# Patient Record
Sex: Male | Born: 2005 | Race: White | Hispanic: No | Marital: Single | Smoking: Never smoker
Health system: Southern US, Community
[De-identification: ages and names within clinical notes are randomized; demographics above are authoritative.]

## PROBLEM LIST (undated history)

## (undated) DIAGNOSIS — F988 Other specified behavioral and emotional disorders with onset usually occurring in childhood and adolescence: Secondary | ICD-10-CM

## (undated) DIAGNOSIS — L988 Other specified disorders of the skin and subcutaneous tissue: Secondary | ICD-10-CM

## (undated) DIAGNOSIS — R7303 Prediabetes: Secondary | ICD-10-CM

## (undated) DIAGNOSIS — R0600 Dyspnea, unspecified: Secondary | ICD-10-CM

## (undated) DIAGNOSIS — F909 Attention-deficit hyperactivity disorder, unspecified type: Secondary | ICD-10-CM

## (undated) DIAGNOSIS — T4145XA Adverse effect of unspecified anesthetic, initial encounter: Secondary | ICD-10-CM

## (undated) DIAGNOSIS — F913 Oppositional defiant disorder: Secondary | ICD-10-CM

## (undated) DIAGNOSIS — T8859XA Other complications of anesthesia, initial encounter: Secondary | ICD-10-CM

## (undated) HISTORY — PX: FRENULECTOMY, LINGUAL: SHX1681

---
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
## 2023-04-13 NOTE — ED Provider Notes (Signed)
 [facility] Emergency Department Provider Note   ED Clinical Impression   Final diagnoses:  Cat bite, initial encounter (Primary)  Rabies, need for prophylactic vaccination against     History   Chief Complaint Chief Complaint  Patient presents with  . Cat Bite    HPI  Sean Burnett is a 18 y.o. male with no pertinent past medical history presenting secondary to a bite to his L thumb from a stray cat around 9:40 PM. No other injuries from the incident. He was cleaning out his house and was outside, when he was bitten by the cat. Unknown vaccination status of cat. Patient is unsure of his last TdaP. No daily medications.    Impression, Medical Decision Making, ED Course   Impression and MDM: 18 y.o. male with the above history presenting secondary to a cat bite as discussed above. Overall well appearing. Vitals and full exam as below.  Examination notable for multiple punctate wounds which are hemostatic on the left first distal phalanx.  No other injuries identified.  No laceration to require suture.  No erythema, fever, or other indications of cellulitis.  Given that it was a stray cat, unable to quarantine, will provide rabies immunoglobulin and vaccine in addition to updating tetanus and providing prophylactic antibiotics.  Will plan for discharge with follow-up on day 3, 7, and 14 for repeat doses of rabies vaccine.  Discussed infectious/return precautions, appropriate for discharge.    MDM Elements Discussion of Management with other Physicians, QHP or Appropriate Source: None Independent Interpretation of Studies: None I have reviewed recent and relevant previous record, including: Outpatient notes - Gen surg note from 12/04/22 Escalation of Care including OBS/Admission/Transfer was considered: However, patient was determined to be appropriate for outpatient management. Social Determinants that significantly affected care: None  The case was discussed with the  attending physician, who is in agreement with the above assessment and plan.   No orders of the defined types were placed in this encounter.     Physical Exam   VITAL SIGNS:    Vitals:   04/13/23 0055  BP: 155/82  Pulse: 76  Resp: 16  Temp: 36.1 C (97 F)  TempSrc: Oral  SpO2: 100%  Weight: (!) 104.3 kg (230 lb)  Height: 180.3 cm (5' 11)    Constitutional: Alert and oriented. No acute distress. Eyes: Conjunctivae are normal. HEENT: Normocephalic and atraumatic. Conjunctivae clear.  Cardiovascular: Rate as above, regular rhythm. Normal and symmetric distal pulses. No lower extremity edema Respiratory: Normal respiratory effort. Clear breath sounds bilaterally. No wheezes, rhonchi, rales.  Genitourinary: Deferred. Musculoskeletal: Non-tender with normal range of motion in all extremities. Neurologic: Normal speech and language. No facial asymmetry. No gross focal neurologic deficits are appreciated. Patient moves all extremities equally. Skin: Punctate hemostatic wounds on the first distal phalanx of the left hand.  No laceration or surrounding erythema. Psychiatric: Mood, affect, and speech grossly normal.     Other History   Past Medical History:  Diagnosis Date  . ADHD (attention deficit hyperactivity disorder)     Past Surgical History:  Procedure Laterality Date  . ORTHOPEDIC SURGERY     leg fx     Current Facility-Administered Medications:  .  amoxicillin -clavulanate (AUGMENTIN ) 875-125 mg per tablet 1 tablet, 1 tablet, Oral, Once, Puscheck, Jesse A, MD .  diph,pertuss(acel),tetanus vaccine-Tdap (BOOSTRIX) injection 0.5 mL, 0.5 mL, Intramuscular, Once, Puscheck, Jesse A, MD .  rabies immune globulin (PF) (HyperRAB) injection, 20 Units/kg, Intramuscular, Once, Puscheck, Josefa LABOR, MD .  rabies vaccine, PCEC (RABAVERT) injection, 1 mL, Intramuscular, Once, Puscheck, Josefa LABOR, MD  Current Outpatient Medications:  .  ADDERALL XR 10 mg 24 hr capsule, TAKE 2  CAPSULES BY MOUTH ONCE DAILY IN THE MORNING, Disp: , Rfl:  .  amoxicillin -clavulanate (AUGMENTIN ) 875-125 mg per tablet, Take 1 tablet by mouth two (2) times a day for 6 days., Disp: 12 tablet, Rfl: 0 .  cloNIDine  HCL (CATAPRES ) 0.1 MG tablet, Take 0.1 mg by mouth., Disp: , Rfl:  .  eszopiclone (LUNESTA) 2 MG Tab, , Disp: , Rfl:   Allergies Aripiprazole, Codeine, and Risperidone  Family History History reviewed. No pertinent family history.  Social History Social History   Tobacco Use  . Smoking status: Never  . Smokeless tobacco: Never  Substance Use Topics  . Alcohol use: Never     Radiology   No orders to display    Pertinent labs & imaging results that were available during my care of the patient were independently interpreted by me and considered in my medical decision making (see chart for details).  Portions of this record have been created using Scientist, clinical (histocompatibility and immunogenetics). Dictation errors have been sought, but may not have been identified and corrected.  Documentation assistance was provided by Rodolph Butcher, Scribe on April 13, 2023 at 1:03 AM for [facility], DO.  April 13, 2023 2:37 AM. Documentation assistance provided by the scribe. I was present during the time the encounter was recorded. The information recorded by the scribe was done at my direction and has been reviewed and validated by me.       Puscheck, Josefa LABOR, MD Resident 04/13/23 [phone]

## 2023-04-13 NOTE — ED Triage Notes (Signed)
 Patient reports he was bitten by a stray cat to left thumb PTA. Unknown rabies vaccination.  Unknown when last tetanus was.

## 2023-04-13 NOTE — ED Notes (Signed)
 [facility] Emergency Department Attestation Note     ED Clinical Impression    Final diagnoses:  Cat bite, initial encounter (Primary)  Rabies, need for prophylactic vaccination against      ED Attending Physician Teaching Attestation    I supervised care provided by the resident. We have discussed the case, I have reviewed the note and I agree with the plan of treatment except as documented in my note. I saw and evaluated the patient, participating in the key portions of the patient's care.    Procedure performed: none    ED Attending Note    Vitals:   04/13/23 0055  BP: 155/82  Pulse: 76  Resp: 16  Temp: 36.1 C (97 F)  TempSrc: Oral  SpO2: 100%  Weight: (!) 104.3 kg (230 lb)  Height: 180.3 cm (5' 11)    I have reviewed the patient's vital signs and the nursing notes. Any pertinent labs & imaging results which were available during my care of the patient were reviewed by me. See chart, resident and nursing documentation for additional ED course details.  Portions of this record have been created using Scientist, clinical (histocompatibility and immunogenetics). Dictation errors have been sought, but may not have been identified and corrected.

## 2023-04-16 NOTE — ED Triage Notes (Signed)
 Pt is here for 2nd rabies series vaccine, states his cat bite wound is healing well with no issues.

## 2023-04-23 NOTE — ED Triage Notes (Signed)
 Pt presents here for follow up series. Denies any issues from previous shots

## 2023-05-16 NOTE — Progress Notes (Signed)
 [facility] [employer] was able to reach the patient by phone call.  The details of interventions are as follows:  Preventive Care-     05/16/2023    2:43 PM  Gap Closure  Age Group: Pediatric  Pediatric Yearly Physical Interventions [facility] direct scheduled appointment  Comments: Scheduled for 06/12/23    Sean Burnett  For more information on [facility] services, click here.

## 2023-07-10 NOTE — Progress Notes (Signed)
 [facility] [employer] was not able to reach the patient by phone call.  The details of interventions are as follows:  Preventive Care-     07/10/2023   11:10 AM  Gap Closure  Age Group: Pediatric  Pediatric Yearly Physical Interventions Unable to reach-- reminder message sent  Comments: lvm    Sean Burnett  For more information on [facility] services, click here.

## 2023-08-26 NOTE — Progress Notes (Signed)
 Chief Complaint  Patient presents with  . Annual Exam    Subjective  Sean Burnett is a 18 y.o. male who presents for Annual Exam HPI History of Present Illness Sean Burnett is a 18 year old male who presents for an annual physical exam and a referral for mental health evaluation.  He is concerned about anxiety, depression, and possible bipolar disorder. He has not been previously diagnosed with these conditions nor has he seen a psychiatrist. A depression and anxiety screening indicated a depression score of two and an anxiety score of thirteen. He has not been screened for bipolar disorder and is uncertain about past treatment for depression or anxiety. He denies experiencing hallucinations or extreme mood swings.  He has experienced a significant weight gain from 234 pounds to 259 pounds over the past eight months, which he attributes to being unemployed and possibly eating more due to anxiety. He was previously employed at a [employer] center but lost his job due to issues obtaining a doctor's note for dental problems. He is concerned about his weight gain and reports having had slightly elevated glucose levels in the past.  He wants to get screened for diabetes  He reports occasional food insecurity, having filled out a questionnaire indicating that he sometimes runs out of food and worries about it. He lives alone and has been unemployed, which may contribute to this issue.  He uses nicotine but did not specify alcohol consumption. No current use of other substances.  Review of Systems  Patient Active Problem List  Diagnosis  . ADHD (attention deficit hyperactivity disorder)  . Behavior disturbance  . Oppositional defiant disorder  . Other specified episodic mood disorder ()  . BMI (body mass index), pediatric, 95-99% for age  . Elevated blood pressure    Outpatient Medications Prior to Visit  Medication Sig Dispense Refill  . ADDERALL XR 10 mg XR capsule  (Patient  not taking: Reported on 08/26/2023)    . eszopiclone (LUNESTA) 2 MG tablet  (Patient not taking: Reported on 08/26/2023)     No facility-administered medications prior to visit.      Objective  Vitals:   08/26/23 1057  BP: 128/80  Pulse: 66  SpO2: 97%  Weight: (!) 117.5 kg (259 lb)  Height: 177.2 cm (5' 9.78)  PainSc: 0-No pain   Body mass index is 37.39 kg/m.  Home Vitals:     Physical Exam Physical Exam MEASUREMENTS: Weight- 259. HEENT: Oral cavity dry.  Constitutional: alert, obese, in NAD, and communicates well Eye exam: pupils equal and reactive, extraocular eye movements intact. Neck: supple, no thyroid enlargement or cervical adenopathy, and no bruits heard Respiratory: clear to auscultation, without rales or wheezes  Cardiovascular: regular rate and rhythm and without murmurs, rubs or gallops Lower extremities: no lower extremity edema Skin ankles/feet: warm, good capillary refill Neurological: sensorimotor grossly intact and normal muscle tone  Results       Assessment/Plan:   Assessment & Plan  Anxiety disorder Positive anxiety screening with a score of 13. No prior psychiatric evaluation or treatment. Anxiety may contribute to weight gain and eating habits. No evidence of bipolar disorder, hallucinations, or manic episodes. - Refer to behavioral specialist for further evaluation and management - Discuss potential for psychiatric referral and medication management if indicated  Class II obesity BMI 37 Weight increased from 234 lbs to 259 lbs over 8 months. Possible contribution from anxiety and unemployment. Reports not eating excessively but acknowledges weight gain. - Encourage lifestyle  modifications including increased physical activity and dietary changes - Discuss potential impact of anxiety on eating habits  Elevated glucose Patient with family history of potential diabetes.  He reports he has gained tremendous amount of weight and he has not  been careful with his diet. We will screen for diabetes using an hemoglobin A1c.  Adult Wellness Visit Routine adult wellness visit with weight gain from 234 lbs to 259 lbs over 8 months. Positive anxiety screening. No significant findings on depression screening. Limited family contact precludes knowledge of family history of diabetes. Unemployment may contribute to weight gain. - Perform physical examination - Order lab tests including glucose, cholesterol, HIV, and hepatitis C screening  Nicotine dependence Patient was counseled about the importance of stopping use of tobacco products particularly smoking.  He said he is not ready for it.  Food insecurity Reported occasional food insecurity with concerns about running out of food. Referral to [facility] program for assistance accepted. - Refer to [facility] program for assistance with food insecurity Diagnoses and all orders for this visit:  Anxiety -     Anxiety Screen [NUR6106] -     Ambulatory Referral to [facility] Pediatric Behavioral Health (23 Years and Under)  Class 2 severe obesity due to excess calories with serious comorbidity and body mass index (BMI) of 37.0 to 37.9 in adult (CMS/HHS-HCC)  Encounter for behavioral health screening (Z13.30)  Elevated glucose -     Hemoglobin A1C; Future  Need for vaccination  Depression screening (Z13.31) -     Depression Screen -(PHQ- 2/9, BDI)  Encounter for routine history and physical exam for male -     Anxiety Screen [NUR6106]  Screening for HIV (human immunodeficiency virus) -     HIV-1 And HIV-2 Antibody & Antigen  Need for hepatitis C screening test -     Hepatitis C Antibody with reflex to PCR  Screening for cardiovascular condition -     Lipid Panel W/Reflex Direct Low Density Lipoprotein (LDL) Cholesterol  Need for financial support -     Ambulatory Referral to Centralized Social Support/[facility])    This visit was coded based on medical decision making  (MDM).           Future Appointments   This patient does not currently have any appointments scheduled.     Patient Instructions  Diagnoses and all orders for this visit:  Anxiety -     Anxiety Screen [NUR6106] -     Ambulatory Referral to [facility]al [facility] (23 Years and Under)  Class 2 severe obesity due to excess calories with serious comorbidity and body mass index (BMI) of 37.0 to 37.9 in adult (CMS/HHS-HCC)  Encounter for behavioral health screening (Z13.30)  Elevated glucose -     Hemoglobin A1C; Future  Need for vaccination  Depression screening (Z13.31) -     Depression Screen -(PHQ- 2/9, BDI)  Encounter for routine history and physical exam for male -     Anxiety Screen [NUR6106]  Screening for HIV (human immunodeficiency virus) -     HIV-1 And HIV-2 Antibody & Antigen  Need for hepatitis C screening test -     Hepatitis C Antibody with reflex to PCR  Screening for cardiovascular condition -     Lipid Panel W/Reflex Direct Low Density Lipoprotein (LDL) Cholesterol  Need for financial support -     Ambulatory Referral to Centralized Social Support/[facility])     An after visit summary was provided for the patient either  in written format (printed) or through [facility].  This note has been created using automated tools and reviewed for accuracy by MARIO E OLMEDO.

## 2023-08-30 NOTE — Progress Notes (Signed)
  [facility] Care Management - Unable to Reach for New Service Line Referral  [facility] Coordinator made multiple attempts to contact the patient for a community resources. Contact information was left with patient.  At this time, we have not been able to connect Sean Burnett to [employer].   Sean Burnett    [address]; [place], [place] [address] l  [email or web address] l [phone]   For more information on [facility] services, click here.  [facility] Centralized Support Referral Closure Note Sean Burnett was referred by provider for assistance with health-related social needs:  Care concerns addressed as described above. No further action needed at this time. Closing case.  Provider notified on: 08/30/23.    Sean Burnett    [address]; [place], [place] [address] l  [email or web address] l [phone]   For more information on [facility] services, click here.

## 2023-11-05 ENCOUNTER — Ambulatory Visit: Payer: Self-pay | Admitting: Surgery

## 2023-11-05 NOTE — H&P (Signed)
 Subjective:   CC: Pilonidal disease [L98.8]  HPI: returns for evaluation of above.   History of Present Illness Sean Burnett is a 18 year old male who presents with a recurrent, enlarging, and painful pilonidal disease.  Area enlarges, becomes painful, and fills with fluid more rapidly than before. Warm compresses are sometimes effective in managing swelling, but significant pain persists. area presents as a red spot that enlarges and leaks fluid, leaving a noticeable cavity. Currently, there are no signs of infection.    Past Medical History:  has a past medical history of ADHD (attention deficit hyperactivity disorder), Oppositional defiant disorder, and School problem.  Past Surgical History:  has no past surgical history on file.  Family History: family history includes ADD / ADHD in his father and mother; Bipolar disorder in his father; Cancer in his paternal grandfather; Multiple sclerosis in his paternal grandmother; Substance Abuse in his father.  Social History:  reports that he has never smoked. He has never used smokeless tobacco. He reports that he does not currently use alcohol. He reports that he does not currently use drugs.  Current Medications: currently has no medications in their medication list.  Allergies:  Allergies  Allergen Reactions   Abilify [Aripiprazole] Unknown   Codeine Unknown   Risperidone Unknown    ROS:  A 15 point review of systems was performed and pertinent positives and negatives noted in HPI   Objective:     BP 115/79 (BP Location: Left upper arm, Patient Position: Sitting, BP Cuff Size: Large Adult)   Pulse 70   Ht 175.3 cm (5' 9)   Wt (!) 117.5 kg (259 lb)   BMI 38.25 kg/m   Constitutional :  No distress, cooperative, alert  Lymphatics/Throat:  Supple with no lymphadenopathy  Respiratory:  Clear to auscultation bilaterally  Cardiovascular:  Regular rate and rhythm  Gastrointestinal: Soft, non-tender, non-distended, no  organomegaly.  Musculoskeletal: Steady gait and movement  Skin: Cool and moist, obvious pilonidal disease, no active infection.  Psychiatric: Normal affect, non-agitated, not confused         LABS:  N/a   RADS: N/a  Assessment:      Pilonidal disease [L98.8]  Plan:     1. Pilonidal disease [L98.8] Discussed surgical excision.  Alternatives include continued observation.  Benefits include possible symptom relief, pathologic evaluation, improved cosmesis. Discussed the risk of surgery including recurrence, chronic pain, post-op infxn, poor cosmesis, poor/delayed wound healing, and possible re-operation to address said risks. The risks of general anesthetic, if used, includes MI, CVA, sudden death or even reaction to anesthetic medications also discussed.  Typical post-op recovery time of 3-5 days with possible activity restrictions were also discussed.  The patient verbalized understanding and all questions were answered to the patient's satisfaction.  2. Patient has elected to proceed with surgical treatment. Procedure will be scheduled. With bascom flap.  labs/images/medications/previous chart entries reviewed personally and relevant changes/updates noted above.

## 2023-11-05 NOTE — H&P (View-Only) (Signed)
 Subjective:   CC: Pilonidal disease [L98.8]  HPI: returns for evaluation of above.   History of Present Illness Sean Burnett is a 18 year old male who presents with a recurrent, enlarging, and painful pilonidal disease.  Area enlarges, becomes painful, and fills with fluid more rapidly than before. Warm compresses are sometimes effective in managing swelling, but significant pain persists. area presents as a red spot that enlarges and leaks fluid, leaving a noticeable cavity. Currently, there are no signs of infection.    Past Medical History:  has a past medical history of ADHD (attention deficit hyperactivity disorder), Oppositional defiant disorder, and School problem.  Past Surgical History:  has no past surgical history on file.  Family History: family history includes ADD / ADHD in his father and mother; Bipolar disorder in his father; Cancer in his paternal grandfather; Multiple sclerosis in his paternal grandmother; Substance Abuse in his father.  Social History:  reports that he has never smoked. He has never used smokeless tobacco. He reports that he does not currently use alcohol. He reports that he does not currently use drugs.  Current Medications: currently has no medications in their medication list.  Allergies:  Allergies  Allergen Reactions   Abilify [Aripiprazole] Unknown   Codeine Unknown   Risperidone Unknown    ROS:  A 15 point review of systems was performed and pertinent positives and negatives noted in HPI   Objective:     BP 115/79 (BP Location: Left upper arm, Patient Position: Sitting, BP Cuff Size: Large Adult)   Pulse 70   Ht 175.3 cm (5' 9)   Wt (!) 117.5 kg (259 lb)   BMI 38.25 kg/m   Constitutional :  No distress, cooperative, alert  Lymphatics/Throat:  Supple with no lymphadenopathy  Respiratory:  Clear to auscultation bilaterally  Cardiovascular:  Regular rate and rhythm  Gastrointestinal: Soft, non-tender, non-distended, no  organomegaly.  Musculoskeletal: Steady gait and movement  Skin: Cool and moist, obvious pilonidal disease, no active infection.  Psychiatric: Normal affect, non-agitated, not confused         LABS:  N/a   RADS: N/a  Assessment:      Pilonidal disease [L98.8]  Plan:     1. Pilonidal disease [L98.8] Discussed surgical excision.  Alternatives include continued observation.  Benefits include possible symptom relief, pathologic evaluation, improved cosmesis. Discussed the risk of surgery including recurrence, chronic pain, post-op infxn, poor cosmesis, poor/delayed wound healing, and possible re-operation to address said risks. The risks of general anesthetic, if used, includes MI, CVA, sudden death or even reaction to anesthetic medications also discussed.  Typical post-op recovery time of 3-5 days with possible activity restrictions were also discussed.  The patient verbalized understanding and all questions were answered to the patient's satisfaction.  2. Patient has elected to proceed with surgical treatment. Procedure will be scheduled. With bascom flap.  labs/images/medications/previous chart entries reviewed personally and relevant changes/updates noted above.

## 2023-11-15 ENCOUNTER — Encounter
Admission: RE | Admit: 2023-11-15 | Discharge: 2023-11-15 | Disposition: A | Discharge status: Home / Self Care | Source: Ambulatory Visit | Attending: Surgery | Admitting: Surgery

## 2023-11-15 ENCOUNTER — Other Ambulatory Visit: Payer: Self-pay

## 2023-11-15 HISTORY — DX: Dyspnea, unspecified: R06.00

## 2023-11-15 HISTORY — DX: Other specified disorders of the skin and subcutaneous tissue: L98.8

## 2023-11-15 HISTORY — DX: Prediabetes: R73.03

## 2023-11-15 NOTE — Patient Instructions (Addendum)
 Your procedure is scheduled on: 11/21/23 - Thursday Report to the Registration Desk on the 1st floor of the [facility]. To find out your arrival time, please call [phone] between 1PM - 3PM on: 11/20/23 - Wednesday If your arrival time is 6:00 am, do not arrive before that time as the [facility] entrance doors do not open until 6:00 am.  REMEMBER: Instructions that are not followed completely may result in serious medical risk, up to and including death; or upon the discretion of your surgeon and anesthesiologist your surgery may need to be rescheduled.  Do not eat food after midnight the night before surgery.  No gum chewing or hard candies.  You may however, drink CLEAR liquids up to 2 hours before you are scheduled to arrive for your surgery. Do not drink anything within 2 hours of your scheduled arrival time.  Clear liquids include: - water  - apple juice without pulp - gatorade (not RED colors) - black coffee or tea (Do NOT add milk or creamers to the coffee or tea) Do NOT drink anything that is not on this list.  One week prior to surgery: Stop Anti-inflammatories (NSAIDS) such as Advil , Aleve , Ibuprofen , Motrin , Naproxen , Naprosyn  and Aspirin based products such as Excedrin, Goody's Powder, BC Powder. You may take Tylenol  if needed for pain up until the day of surgery.  Stop ANY OVER THE COUNTER supplements until after surgery.  ON THE DAY OF SURGERY ONLY TAKE THESE MEDICATIONS WITH SIPS OF WATER:  none   No Alcohol for 24 hours before or after surgery.  No Smoking including e-cigarettes for 24 hours before surgery.  No chewable tobacco products for at least 6 hours before surgery.  No nicotine patches on the day of surgery.  Do not use any recreational drugs for at least a week (preferably 2 weeks) before your surgery.  Please be advised that the combination of cocaine and anesthesia may have negative outcomes, up to and including death. If you test positive  for cocaine, your surgery will be cancelled.  On the morning of surgery brush your teeth with toothpaste and water, you may rinse your mouth with mouthwash if you wish. Do not swallow any toothpaste or mouthwash.  Do not wear jewelry, make-up, hairpins, clips or nail polish.  For welded (permanent) jewelry: bracelets, anklets, waist bands, etc.  Please have this removed prior to surgery.  If it is not removed, there is a chance that hospital personnel will need to cut it off on the day of surgery.  Do not wear lotions, powders, or perfumes.   Do not shave body hair from the neck down 48 hours before surgery.  Contact lenses, hearing aids and dentures may not be worn into surgery.  Do not bring valuables to the hospital. [facility] is not responsible for any missing/lost belongings or valuables.   Notify your doctor if there is any change in your medical condition (cold, fever, infection).  Wear comfortable clothing (specific to your surgery type) to the hospital.  If you are being admitted to the hospital overnight, leave your suitcase in the car. After surgery it may be brought to your room.  In case of increased patient census, it may be necessary for you, the patient, to continue your postoperative care in the Same Day Surgery department.  If you are being discharged the day of surgery, you will not be allowed to drive home. You will need a responsible individual to drive you home and stay  with you for 24 hours after surgery.   If you are taking public transportation, you will need to have a responsible individual with you.  Please call the Pre-admissions Testing Dept. at [phone] if you have any questions about these instructions.  Surgery Visitation Policy:  Patients having surgery or a procedure may have two visitors.  Children under the age of 34 must have an adult with them who is not the patient.  Inpatient Visitation:    Visiting hours are 7 a.m. to 8 p.m. Up  to four visitors are allowed at one time in a patient room. The visitors may rotate out with other people during the day.  One visitor age 48 or older may stay with the patient overnight and must be in the room by 8 p.m.   Merch[employer]iser, Retail to address health-related social needs:  [email or web address]

## 2023-11-20 MED ORDER — ORAL CARE MOUTH RINSE: 15.0000 mL | Freq: Once | OROMUCOSAL | Status: AC

## 2023-11-20 MED ORDER — CEFAZOLIN SODIUM-DEXTROSE 2-4 GM/100ML-% IV SOLN
2.0000 g | INTRAVENOUS | Status: AC
  Administered 2023-11-21: 2 g via INTRAVENOUS

## 2023-11-20 MED ORDER — ACETAMINOPHEN 500 MG PO TABS
1000.0000 mg | ORAL_TABLET | ORAL | Status: AC
  Administered 2023-11-21: 1000 mg via ORAL

## 2023-11-20 MED ORDER — CHLORHEXIDINE GLUCONATE 0.12 % MT SOLN
15.0000 mL | Freq: Once | OROMUCOSAL | Status: AC
  Administered 2023-11-21: 15 mL via OROMUCOSAL

## 2023-11-20 MED ORDER — CHLORHEXIDINE GLUCONATE CLOTH 2 % EX PADS: 6.0000 | MEDICATED_PAD | Freq: Once | CUTANEOUS | Status: DC

## 2023-11-20 MED ORDER — GABAPENTIN 300 MG PO CAPS
300.0000 mg | ORAL_CAPSULE | ORAL | Status: AC
  Administered 2023-11-21: 300 mg via ORAL

## 2023-11-20 MED ORDER — LACTATED RINGERS IV SOLN: INTRAVENOUS | Status: DC

## 2023-11-21 ENCOUNTER — Ambulatory Visit: Admitting: Anesthesiology

## 2023-11-21 ENCOUNTER — Encounter: Payer: Self-pay | Admitting: Surgery

## 2023-11-21 ENCOUNTER — Other Ambulatory Visit: Payer: Self-pay

## 2023-11-21 ENCOUNTER — Ambulatory Visit
Admission: RE | Admit: 2023-11-21 | Discharge: 2023-11-21 | Disposition: A | Discharge status: Home / Self Care | Attending: Surgery | Admitting: Surgery

## 2023-11-21 ENCOUNTER — Encounter
Admission: RE | Disposition: A | Discharge status: Home / Self Care | Payer: Self-pay | Source: Home / Self Care | Attending: Surgery

## 2023-11-21 DIAGNOSIS — L0591 Pilonidal cyst without abscess: Secondary | ICD-10-CM | POA: Diagnosis present

## 2023-11-21 HISTORY — PX: PILONIDAL CYST DRAINAGE: SHX743

## 2023-11-21 HISTORY — PX: PROCEDURE, ADVANCEMENT FLAP, BACK: SHX7623

## 2023-11-21 SURGERY — EXCISION, PILONIDAL CYST
Anesthesia: General | Site: Buttocks

## 2023-11-21 MED ORDER — CEFAZOLIN SODIUM-DEXTROSE 2-4 GM/100ML-% IV SOLN
INTRAVENOUS | Status: AC
  Filled 2023-11-21: qty 100

## 2023-11-21 MED ORDER — LIDOCAINE HCL (PF) 2 % IJ SOLN
INTRAMUSCULAR | Status: AC
  Filled 2023-11-21: qty 5

## 2023-11-21 MED ORDER — FENTANYL CITRATE (PF) 100 MCG/2ML IJ SOLN: 25.0000 ug | INTRAMUSCULAR | Status: DC | PRN

## 2023-11-21 MED ORDER — KETOROLAC TROMETHAMINE 30 MG/ML IJ SOLN
INTRAMUSCULAR | Status: AC
  Filled 2023-11-21: qty 1

## 2023-11-21 MED ORDER — OXYCODONE HCL 5 MG PO TABS: 5.0000 mg | ORAL_TABLET | Freq: Once | ORAL | Status: DC | PRN

## 2023-11-21 MED ORDER — ONDANSETRON HCL 4 MG/2ML IJ SOLN
4.0000 mg | Freq: Once | INTRAMUSCULAR | Status: AC | PRN
  Administered 2023-11-21: 4 mg via INTRAVENOUS

## 2023-11-21 MED ORDER — GABAPENTIN 300 MG PO CAPS
ORAL_CAPSULE | ORAL | Status: AC
  Filled 2023-11-21: qty 1

## 2023-11-21 MED ORDER — BUPIVACAINE-EPINEPHRINE 0.5% -1:200000 IJ SOLN
INTRAMUSCULAR | Status: DC | PRN
Start: 2023-11-21 — End: 2023-11-21
  Administered 2023-11-21: 30 mL

## 2023-11-21 MED ORDER — ROCURONIUM BROMIDE 100 MG/10ML IV SOLN
INTRAVENOUS | Status: DC | PRN
  Administered 2023-11-21: 50 mg via INTRAVENOUS

## 2023-11-21 MED ORDER — ACETAMINOPHEN 10 MG/ML IV SOLN: 1000.0000 mg | Freq: Once | INTRAVENOUS | Status: DC | PRN

## 2023-11-21 MED ORDER — CHLORHEXIDINE GLUCONATE 0.12 % MT SOLN
OROMUCOSAL | Status: AC
Start: 2023-11-21 — End: 2023-11-21
  Filled 2023-11-21: qty 15

## 2023-11-21 MED ORDER — PROPOFOL 10 MG/ML IV BOLUS
INTRAVENOUS | Status: AC
  Filled 2023-11-21: qty 20

## 2023-11-21 MED ORDER — SUGAMMADEX SODIUM 200 MG/2ML IV SOLN
INTRAVENOUS | Status: DC | PRN
  Administered 2023-11-21: 200 mg via INTRAVENOUS

## 2023-11-21 MED ORDER — ROCURONIUM BROMIDE 10 MG/ML (PF) SYRINGE
PREFILLED_SYRINGE | INTRAVENOUS | Status: AC
  Filled 2023-11-21: qty 10

## 2023-11-21 MED ORDER — LIDOCAINE HCL (CARDIAC) PF 100 MG/5ML IV SOSY
PREFILLED_SYRINGE | INTRAVENOUS | Status: DC | PRN
  Administered 2023-11-21: 60 mg via INTRAVENOUS

## 2023-11-21 MED ORDER — ONDANSETRON HCL 4 MG/2ML IJ SOLN
INTRAMUSCULAR | Status: AC
  Filled 2023-11-21: qty 2

## 2023-11-21 MED ORDER — OXYCODONE HCL 5 MG/5ML PO SOLN: 5.0000 mg | Freq: Once | ORAL | Status: DC | PRN

## 2023-11-21 MED ORDER — PROPOFOL 10 MG/ML IV BOLUS
INTRAVENOUS | Status: DC | PRN
  Administered 2023-11-21: 200 mg via INTRAVENOUS

## 2023-11-21 MED ORDER — ACETAMINOPHEN 325 MG PO TABS
650.0000 mg | ORAL_TABLET | Freq: Three times a day (TID) | ORAL | 0 refills | Status: AC | PRN
  Filled 2023-11-21: qty 40, 7d supply, fill #0

## 2023-11-21 MED ORDER — KETOROLAC TROMETHAMINE 30 MG/ML IJ SOLN
INTRAMUSCULAR | Status: DC | PRN
  Administered 2023-11-21: 30 mg via INTRAVENOUS

## 2023-11-21 MED ORDER — FENTANYL CITRATE (PF) 100 MCG/2ML IJ SOLN
INTRAMUSCULAR | Status: DC | PRN
  Administered 2023-11-21: 100 ug via INTRAVENOUS

## 2023-11-21 MED ORDER — OXYCODONE-ACETAMINOPHEN 5-325 MG PO TABS
1.0000 | ORAL_TABLET | Freq: Three times a day (TID) | ORAL | 0 refills | Status: AC | PRN
  Filled 2023-11-21: qty 6, 2d supply, fill #0

## 2023-11-21 MED ORDER — ACETAMINOPHEN 500 MG PO TABS
ORAL_TABLET | ORAL | Status: AC
  Filled 2023-11-21: qty 2

## 2023-11-21 MED ORDER — FENTANYL CITRATE (PF) 100 MCG/2ML IJ SOLN
INTRAMUSCULAR | Status: AC
  Filled 2023-11-21: qty 2

## 2023-11-21 MED ORDER — LACTATED RINGERS IV SOLN: INTRAVENOUS | Status: DC

## 2023-11-21 MED ORDER — MIDAZOLAM HCL (PF) 2 MG/2ML IJ SOLN
INTRAMUSCULAR | Status: DC | PRN
  Administered 2023-11-21: 2 mg via INTRAVENOUS

## 2023-11-21 MED ORDER — IBUPROFEN 800 MG PO TABS
800.0000 mg | ORAL_TABLET | Freq: Three times a day (TID) | ORAL | 0 refills | Status: AC | PRN
  Filled 2023-11-21: qty 30, 10d supply, fill #0

## 2023-11-21 MED ORDER — DOCUSATE SODIUM 100 MG PO CAPS
100.0000 mg | ORAL_CAPSULE | Freq: Two times a day (BID) | ORAL | 0 refills | Status: AC | PRN
  Filled 2023-11-21: qty 20, 10d supply, fill #0

## 2023-11-21 MED ORDER — ONDANSETRON HCL 4 MG/2ML IJ SOLN
INTRAMUSCULAR | Status: DC | PRN
  Administered 2023-11-21: 4 mg via INTRAVENOUS

## 2023-11-21 MED ORDER — SODIUM CHLORIDE (PF) 0.9 % IJ SOLN
INTRAMUSCULAR | Status: AC
  Filled 2023-11-21: qty 10

## 2023-11-21 MED ORDER — HYDROMORPHONE HCL 1 MG/ML IJ SOLN
INTRAMUSCULAR | Status: AC
  Filled 2023-11-21: qty 1

## 2023-11-21 MED ORDER — MIDAZOLAM HCL 2 MG/2ML IJ SOLN
INTRAMUSCULAR | Status: AC
  Filled 2023-11-21: qty 2

## 2023-11-21 MED ORDER — DEXAMETHASONE SOD PHOSPHATE PF 10 MG/ML IJ SOLN
INTRAMUSCULAR | Status: DC | PRN
  Administered 2023-11-21: 5 mg via INTRAVENOUS

## 2023-11-21 MED ORDER — BUPIVACAINE-EPINEPHRINE (PF) 0.5% -1:200000 IJ SOLN
INTRAMUSCULAR | Status: AC
Start: 2023-11-21 — End: 2023-11-21
  Filled 2023-11-21: qty 30

## 2023-11-21 MED ORDER — HYDROMORPHONE HCL 1 MG/ML IJ SOLN
INTRAMUSCULAR | Status: DC | PRN
  Administered 2023-11-21 (×2): .5 mg via INTRAVENOUS

## 2023-11-21 SURGICAL SUPPLY — 34 items
ADHESIVE MASTISOL STRL (MISCELLANEOUS) ×2 IMPLANT
BLADE CLIPPER SURG (BLADE) ×2 IMPLANT
BLADE SURG SZ10 CARB STEEL (BLADE) ×2 IMPLANT
BRIEF MESH DISP 2XL (UNDERPADS AND DIAPERS) ×2 IMPLANT
BRUSH SCRUB EZ 4% CHG (MISCELLANEOUS) ×2 IMPLANT
DERMABOND ADVANCED .7 DNX12 (GAUZE/BANDAGES/DRESSINGS) IMPLANT
DRAIN CHANNEL JP 15F RND 3/16 (MISCELLANEOUS) IMPLANT
DRAPE LAPAROTOMY 100X77 ABD (DRAPES) ×2 IMPLANT
DRSG TEGADERM 4X4.75 (GAUZE/BANDAGES/DRESSINGS) IMPLANT
ELECTRODE REM PT RTRN 9FT ADLT (ELECTROSURGICAL) ×2 IMPLANT
EVACUATOR SILICONE 100CC (DRAIN) IMPLANT
GAUZE 4X4 16PLY ~~LOC~~+RFID DBL (SPONGE) IMPLANT
GAUZE SPONGE 4X4 12PLY STRL (GAUZE/BANDAGES/DRESSINGS) IMPLANT
GLOVE BIOGEL PI IND STRL 7.0 (GLOVE) ×2 IMPLANT
GLOVE SURG SYN 6.5 PF PI (GLOVE) ×6 IMPLANT
GOWN STRL REUS W/ TWL LRG LVL3 (GOWN DISPOSABLE) ×6 IMPLANT
KIT TURNOVER KIT A (KITS) ×2 IMPLANT
MANIFOLD NEPTUNE II (INSTRUMENTS) ×2 IMPLANT
NDL HYPO 22X1.5 SAFETY MO (MISCELLANEOUS) ×2 IMPLANT
NEEDLE HYPO 22X1.5 SAFETY MO (MISCELLANEOUS) IMPLANT
NS IRRIG 500ML POUR BTL (IV SOLUTION) ×2 IMPLANT
PACK BASIN MINOR ARMC (MISCELLANEOUS) ×2 IMPLANT
PAD ABD DERMACEA PRESS 5X9 (GAUZE/BANDAGES/DRESSINGS) ×2 IMPLANT
SOLUTION PREP PVP 2OZ (MISCELLANEOUS) ×2 IMPLANT
SPONGE DRAIN TRACH 4X4 STRL 2S (GAUZE/BANDAGES/DRESSINGS) IMPLANT
SUT VIC AB 3-0 SH 27X BRD (SUTURE) IMPLANT
SUTURE EHLN 3-0 FS-10 30 BLK (SUTURE) IMPLANT
SUTURE MNCRL 4-0 27XMF (SUTURE) IMPLANT
SYR 10ML LL (SYRINGE) ×2 IMPLANT
SYR 20ML LL LF (SYRINGE) ×2 IMPLANT
SYR BULB IRRIG 60ML STRL (SYRINGE) ×2 IMPLANT
TAPE CLOTH 3X10 WHT NS LF (GAUZE/BANDAGES/DRESSINGS) ×2 IMPLANT
TRAP FLUID SMOKE EVACUATOR (MISCELLANEOUS) ×2 IMPLANT
WATER STERILE IRR 500ML POUR (IV SOLUTION) ×2 IMPLANT

## 2023-11-21 NOTE — Anesthesia Postprocedure Evaluation (Signed)
 Anesthesia Post Note  Patient: Fermon Avery Stansell  Procedure(s) Performed: EXCISION, PILONIDAL CYST (Buttocks) PROCEDURE, ADVANCEMENT FLAP, BACK (Back)  Patient location during evaluation: PACU Anesthesia Type: General Level of consciousness: awake and alert, oriented and patient cooperative Pain management: pain level controlled Vital Signs Assessment: post-procedure vital signs reviewed and stable Respiratory status: spontaneous breathing, nonlabored ventilation and respiratory function stable Cardiovascular status: blood pressure returned to baseline and stable Postop Assessment: adequate PO intake Anesthetic complications: no   No notable events documented.   Last Vitals:  Vitals:   11/21/23 1415 11/21/23 1432  BP: (!) 148/65 (!) 158/87  Pulse: 100 92  Resp: 16 16  Temp:  36.7 C  SpO2: 97% 97%    Last Pain:  Vitals:   11/21/23 1432  TempSrc: Temporal  PainSc: 1                  Alfonso Ruths

## 2023-11-21 NOTE — Interval H&P Note (Signed)
 No change. OK to proceed.

## 2023-11-21 NOTE — Anesthesia Preprocedure Evaluation (Addendum)
 Anesthesia Evaluation  Patient identified by MRN, date of birth, ID band Patient awake    Reviewed: Allergy & Precautions, NPO status , Patient's Chart, lab work & pertinent test results  History of Anesthesia Complications (+) Emergence Delirium and history of anesthetic complications (2013, age 18)  Airway Mallampati: II   Neck ROM: Full    Dental  (+) Missing, Loose, Chipped   Pulmonary  Current vaping   Pulmonary exam normal breath sounds clear to auscultation       Cardiovascular Exercise Tolerance: Good negative cardio ROS Normal cardiovascular exam Rhythm:Regular Rate:Normal     Neuro/Psych  PSYCHIATRIC DISORDERS (ADHD, ODD)      negative neurological ROS     GI/Hepatic ,GERD  ,,  Endo/Other  Obesity; prediabetes   Renal/GU negative Renal ROS     Musculoskeletal   Abdominal   Peds  Hematology negative hematology ROS (+)   Anesthesia Other Findings   Reproductive/Obstetrics                              Anesthesia Physical Anesthesia Plan  ASA: 2  Anesthesia Plan: General   Post-op Pain Management:    Induction: Intravenous  PONV Risk Score and Plan: 2 and Ondansetron , Dexamethasone  and Treatment may vary due to age or medical condition  Airway Management Planned: Oral ETT  Additional Equipment:   Intra-op Plan:   Post-operative Plan: Extubation in OR  Informed Consent: I have reviewed the patients History and Physical, chart, labs and discussed the procedure including the risks, benefits and alternatives for the proposed anesthesia with the patient or authorized representative who has indicated his/her understanding and acceptance.     Dental advisory given  Plan Discussed with: CRNA  Anesthesia Plan Comments: (Patient consented for risks of anesthesia including but not limited to:  - adverse reactions to medications - damage to eyes, teeth, lips or other  oral mucosa - nerve damage due to positioning  - sore throat or hoarseness - damage to heart, brain, nerves, lungs, other parts of body or loss of life  Informed patient about role of CRNA in peri- and intra-operative care.  Patient voiced understanding.)         Anesthesia Quick Evaluation

## 2023-11-21 NOTE — Anesthesia Procedure Notes (Signed)
 Procedure Name: Intubation Date/Time: 11/21/2023 12:32 PM  Performed by: Niki Manus SAUNDERS, CRNAPre-anesthesia Checklist: Patient identified, Patient being monitored, Timeout performed, Emergency Drugs available and Suction available Patient Re-evaluated:Patient Re-evaluated prior to induction Oxygen Delivery Method: Circle system utilized Preoxygenation: Pre-oxygenation with 100% oxygen Induction Type: IV induction Ventilation: Mask ventilation without difficulty Laryngoscope Size: Mac and 4 Grade View: Grade I Tube type: Oral Tube size: 7.5 mm Number of attempts: 1 Airway Equipment and Method: Stylet Placement Confirmation: ETT inserted through vocal cords under direct vision, positive ETCO2 and breath sounds checked- equal and bilateral Secured at: 21 cm Tube secured with: Tape Dental Injury: Teeth and Oropharynx as per pre-operative assessment

## 2023-11-21 NOTE — Transfer of Care (Signed)
 Immediate Anesthesia Transfer of Care Note  Patient: Sean Burnett  Procedure(s) Performed: EXCISION, PILONIDAL CYST (Buttocks) PROCEDURE, ADVANCEMENT FLAP, BACK (Back)  Patient Location: PACU  Anesthesia Type:General  Level of Consciousness: awake and alert   Airway & Oxygen Therapy: Patient Spontanous Breathing and Patient connected to nasal cannula oxygen  Post-op Assessment: Report given to RN and Post -op Vital signs reviewed and stable  Post vital signs: Reviewed and stable  Last Vitals:  Vitals Value Taken Time  BP    Temp    Pulse 99 11/21/23 13:58  Resp 16 11/21/23 13:58  SpO2 96 % 11/21/23 13:58  Vitals shown include unfiled device data.  Last Pain:  Vitals:   11/21/23 1124  TempSrc: Temporal  PainSc: 0-No pain         Complications: No notable events documented.

## 2023-11-21 NOTE — Op Note (Signed)
 Pre-Op Dx: pilonidal disease Post-Op Dx: same Anesthesia: GETA EBL: 15mL Complications:  none apparent Specimen: pilonidal disease Procedure: pilonidal cystectomy and bascom flap closure  Surgeon: Tye  Indications for procedure: See H&P  Description of Procedure:  Consent obtained, time out performed.  Patient placed in prone position.  Area sterilized and draped in usual position.  Gluteal fold approximation marked and then separted with tape.  Time out performed. Local infused to area previously marked.  18cm elliptical incision made through dermis with 15blade, incorporating the diseased tissue adjacent to the medial edge of ellipse and pilonidal disease noted in subcutaneous layer.  Skin flap was then raised on the contralateral aspect of the more diseased portion (right side in this patient) to the extent of previously marked fold line.  Tape released and flap noted to extend over midline to contralateral fold mark under minimal tension. The diseased portion was then excised completely down to healthy sacral fascia and surrounding fat, passed off field pending pathology.    Wound irrigated and hemostasis noted, then flap advanced over open wound and closed in multi  layer fashion with 3-0 vicryl in interrupted fashion for deep subq layers.  15Fr drain placed right under dermal layer and flap and secured to skin using 3-0 nylon. Additional 3-0 vicryl used to approximate the dermal layer, then running 4-0 monocryl in subcuticular fashion for epidermal layer.  Wound then dressed with dermabond, drain sponge around drain site.  Pt tolerated procedure well, and transferred to PACU in stable condition. Sponge and instrument count correct at end of procedure.

## 2023-11-21 NOTE — Discharge Instructions (Signed)
 Removal, Care After This sheet gives you information about how to care for yourself after your procedure. Your health care provider may also give you more specific instructions. If you have problems or questions, contact your health care provider. What can I expect after the procedure? After the procedure, it is common to have: Soreness. Bruising. Itching. Follow these instructions at home: site care Follow instructions from your health care provider about how to take care of your site. Make sure you: Wash your hands with soap and water before and after you change your bandage (dressing). If soap and water are not available, use hand sanitizer. Leave stitches (sutures), skin glue, or adhesive strips in place. These skin closures may need to stay in place for 2 weeks or longer. If adhesive strip edges start to loosen and curl up, you may trim the loose edges. Do not remove adhesive strips completely unless your health care provider tells you to do that. If the area bleeds or bruises, apply gentle pressure for 10 minutes. OK TO SHOWER IN 24HRS  Check your site every day for signs of infection. Check for: Redness, swelling, or pain. Fluid or blood. Warmth. Pus or a bad smell.  General instructions Rest and then return to your normal activities as told by your health care provider.  tylenol and advil as needed for discomfort.  Please alternate between the two every four hours as needed for pain.    Use narcotics, if prescribed, only when tylenol and motrin is not enough to control pain.  325-650mg  every 8hrs to max of 3000mg /24hrs (including the 325mg  in every norco dose) for the tylenol.    Advil up to 800mg  per dose every 8hrs as needed for pain.   Keep all follow-up visits as told by your health care provider. This is important. Contact a health care provider if: You have redness, swelling, or pain around your site. You have fluid or blood coming from your site. Your site feels warm to  the touch. You have pus or a bad smell coming from your site. You have a fever. Your sutures, skin glue, or adhesive strips loosen or come off sooner than expected. Get help right away if: You have bleeding that does not stop with pressure or a dressing. Summary After the procedure, it is common to have some soreness, bruising, and itching at the site. Follow instructions from your health care provider about how to take care of your site. Check your site every day for signs of infection. Contact a health care provider if you have redness, swelling, or pain around your site, or your site feels warm to the touch. Keep all follow-up visits as told by your health care provider. This is important. This information is not intended to replace advice given to you by your health care provider. Make sure you discuss any questions you have with your health care provider. Document Released: 01/28/2015 Document Revised: 07/01/2017 Document Reviewed: 07/01/2017 Elsevier Interactive Patient Education  [employer].

## 2023-11-22 ENCOUNTER — Encounter: Payer: Self-pay | Admitting: Surgery

## 2023-11-26 LAB — SURGICAL PATHOLOGY
